# Patient Record
Sex: Female | Born: 1990 | Race: Black or African American | Hispanic: No | Marital: Single | Smoking: Current every day smoker
Health system: Southern US, Community
[De-identification: ages and names within clinical notes are randomized; demographics above are authoritative.]

---

## 2015-09-27 ENCOUNTER — Emergency Department (HOSPITAL_COMMUNITY): Payer: Self-pay

## 2015-09-27 ENCOUNTER — Encounter (HOSPITAL_COMMUNITY): Payer: Self-pay | Admitting: Emergency Medicine

## 2015-09-27 ENCOUNTER — Emergency Department (HOSPITAL_COMMUNITY)
Admission: EM | Admit: 2015-09-27 | Discharge: 2015-09-27 | Disposition: A | Discharge status: Home / Self Care | Payer: Self-pay | Attending: Emergency Medicine | Admitting: Emergency Medicine

## 2015-09-27 DIAGNOSIS — Z79899 Other long term (current) drug therapy: Secondary | ICD-10-CM | POA: Insufficient documentation

## 2015-09-27 DIAGNOSIS — T07XXXA Unspecified multiple injuries, initial encounter: Secondary | ICD-10-CM

## 2015-09-27 DIAGNOSIS — Y999 Unspecified external cause status: Secondary | ICD-10-CM | POA: Insufficient documentation

## 2015-09-27 DIAGNOSIS — Y939 Activity, unspecified: Secondary | ICD-10-CM | POA: Insufficient documentation

## 2015-09-27 DIAGNOSIS — R6884 Jaw pain: Secondary | ICD-10-CM | POA: Insufficient documentation

## 2015-09-27 DIAGNOSIS — F17200 Nicotine dependence, unspecified, uncomplicated: Secondary | ICD-10-CM | POA: Insufficient documentation

## 2015-09-27 DIAGNOSIS — Y929 Unspecified place or not applicable: Secondary | ICD-10-CM | POA: Insufficient documentation

## 2015-09-27 DIAGNOSIS — S61411A Laceration without foreign body of right hand, initial encounter: Secondary | ICD-10-CM | POA: Insufficient documentation

## 2015-09-27 DIAGNOSIS — M542 Cervicalgia: Secondary | ICD-10-CM | POA: Insufficient documentation

## 2015-09-27 MED ORDER — IBUPROFEN 800 MG PO TABS
800.0000 mg | ORAL_TABLET | Freq: Once | ORAL | Status: AC
  Administered 2015-09-27: 800 mg via ORAL
  Filled 2015-09-27: qty 1

## 2015-09-27 MED ORDER — IBUPROFEN 600 MG PO TABS: 600.0000 mg | ORAL_TABLET | Freq: Four times a day (QID) | ORAL | Status: DC | PRN

## 2015-09-27 MED ORDER — BACITRACIN ZINC 500 UNIT/GM EX OINT: 1.0000 "application " | TOPICAL_OINTMENT | Freq: Two times a day (BID) | CUTANEOUS | Status: DC
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
## 2015-09-27 NOTE — Discharge Instructions (Signed)
Contusion A contusion is a deep bruise. Contusions are the result of a blunt injury to tissues and muscle fibers under the skin. The injury causes bleeding under the skin. The skin overlying the contusion may turn blue, purple, or yellow. Minor injuries will give you a painless contusion, but more severe contusions may stay painful and swollen for a few weeks.  CAUSES  This condition is usually caused by a blow, trauma, or direct force to an area of the body. SYMPTOMS  Symptoms of this condition include:  Swelling of the injured area.  Pain and tenderness in the injured area.  Discoloration. The area may have redness and then turn blue, purple, or yellow. DIAGNOSIS  This condition is diagnosed based on a physical exam and medical history. An X-ray, CT scan, or MRI may be needed to determine if there are any associated injuries, such as broken bones (fractures). TREATMENT  Specific treatment for this condition depends on what area of the body was injured. In general, the best treatment for a contusion is resting, icing, applying pressure to (compression), and elevating the injured area. This is often called the RICE strategy. Over-the-counter anti-inflammatory medicines may also be recommended for pain control.  HOME CARE INSTRUCTIONS   Rest the injured area.  If directed, apply ice to the injured area:  Put ice in a plastic bag.  Place a towel between your skin and the bag.  Leave the ice on for 20 minutes, 2-3 times per day.  If directed, apply light compression to the injured area using an elastic bandage. Make sure the bandage is not wrapped too tightly. Remove and reapply the bandage as directed by your health care provider.  If possible, raise (elevate) the injured area above the level of your heart while you are sitting or lying down.  Take over-the-counter and prescription medicines only as told by your health care provider. SEEK MEDICAL CARE IF:  Your symptoms do not  improve after several days of treatment.  Your symptoms get worse.  You have difficulty moving the injured area. SEEK IMMEDIATE MEDICAL CARE IF:   You have severe pain.  You have numbness in a hand or foot.  Your hand or foot turns pale or cold.   This information is not intended to replace advice given to you by your health care provider. Make sure you discuss any questions you have with your health care provider.   Document Released: 01/02/2005 Document Revised: 12/14/2014 Document Reviewed: 08/10/2014 Elsevier Interactive Patient Education 2016 [employer]  Nonsutured Laceration Care A laceration is a cut that goes through all layers of the skin and extends into the tissue that is right under the skin. This type of cut is usually stitched up (sutured) or closed with tape (adhesive strips) or skin glue shortly after the injury happens. However, if the wound is dirty or if several hours pass before medical treatment is provided, it is likely that germs (bacteria) will enter the wound. Closing a laceration after bacteria have entered it increases the risk of infection. In these cases, your health care provider may leave the laceration open (nonsutured) and cover it with a bandage. This type of treatment helps prevent infection and allows the wound to heal from the deepest layer of tissue damage up to the surface. An open fracture is a type of injury that may involve nonsutured lacerations. An open fracture is a break in a bone that happens along with one or more lacerations through the skin that is  near the fracture site. HOW TO CARE FOR YOUR NONSUTURED LACERATION  Take or apply over-the-counter and prescription medicines only as told by your health care provider.  If you were prescribed an antibiotic medicine, take or apply it as told by your health care provider. Do not stop using the antibiotic even if your condition improves.  Clean the wound one time each day or as told by your  health care provider.  Wash the wound with mild soap and water.  Rinse the wound with water to remove all soap.  Pat your wound dry with a clean towel. Do not rub the wound.  Do not inject anything into the wound unless your health care provider told you to.  Change any bandages (dressings) as told by your health care provider. This includes changing the dressing if it gets wet, dirty, or starts to smell bad.  Keep the dressing dry until your health care provider says it can be removed. Do not take baths, swim, or do anything that puts your wound underwater until your health care provider approves.  Raise (elevate) the injured area above the level of your heart while you are sitting or lying down, if possible.  Do not scratch or pick at the wound.  Check your wound every day for signs of infection. Watch for:  Redness, swelling, or pain.  Fluid, blood, or pus.  Keep all follow-up visits as told by your health care provider. This is important. SEEK MEDICAL CARE IF:  You received a tetanus and shot and you have swelling, severe pain, redness, or bleeding at the injection site.   You have a fever.  Your pain is not controlled with medicine.  You have increased redness, swelling, or pain at the site of your wound.  You have fluid, blood, or pus coming from your wound.  You notice a bad smell coming from your wound or your dressing.  You notice something coming out of the wound, such as wood or glass.  You notice a change in the color of your skin near your wound.  You develop a new rash.  You need to change the dressing frequently due to fluid, blood, or pus draining from the wound.  You develop numbness around your wound. SEEK IMMEDIATE MEDICAL CARE IF:  Your pain suddenly increases and is severe.  You develop severe swelling around the wound.  The wound is on your hand or foot and you cannot properly move a finger or toe.  The wound is on your hand or foot and  you notice that your fingers or toes look pale or bluish.  You have a red streak going away from your wound.   This information is not intended to replace advice given to you by your health care provider. Make sure you discuss any questions you have with your health care provider.   Document Released: 02/20/2006 Document Revised: 08/09/2014 Document Reviewed: 03/21/2014 Elsevier Interactive Patient Education [employer]

## 2017-06-19 ENCOUNTER — Emergency Department (HOSPITAL_COMMUNITY): Payer: Medicaid Other

## 2017-06-19 ENCOUNTER — Emergency Department (HOSPITAL_COMMUNITY)
Admission: EM | Admit: 2017-06-19 | Discharge: 2017-06-19 | Disposition: A | Discharge status: Home / Self Care | Payer: Medicaid Other | Attending: Emergency Medicine | Admitting: Emergency Medicine

## 2017-06-19 ENCOUNTER — Encounter (HOSPITAL_COMMUNITY): Payer: Self-pay | Admitting: Emergency Medicine

## 2017-06-19 ENCOUNTER — Other Ambulatory Visit: Payer: Self-pay

## 2017-06-19 DIAGNOSIS — S161XXA Strain of muscle, fascia and tendon at neck level, initial encounter: Secondary | ICD-10-CM | POA: Diagnosis not present

## 2017-06-19 DIAGNOSIS — F17210 Nicotine dependence, cigarettes, uncomplicated: Secondary | ICD-10-CM | POA: Diagnosis not present

## 2017-06-19 DIAGNOSIS — Y999 Unspecified external cause status: Secondary | ICD-10-CM | POA: Insufficient documentation

## 2017-06-19 DIAGNOSIS — M791 Myalgia, unspecified site: Secondary | ICD-10-CM | POA: Diagnosis not present

## 2017-06-19 DIAGNOSIS — Y929 Unspecified place or not applicable: Secondary | ICD-10-CM | POA: Diagnosis not present

## 2017-06-19 DIAGNOSIS — R51 Headache: Secondary | ICD-10-CM | POA: Diagnosis present

## 2017-06-19 DIAGNOSIS — Y9389 Activity, other specified: Secondary | ICD-10-CM | POA: Insufficient documentation

## 2017-06-19 MED ORDER — KETOROLAC TROMETHAMINE 15 MG/ML IJ SOLN
15.0000 mg | Freq: Once | INTRAMUSCULAR | Status: AC
  Administered 2017-06-19: 15 mg via INTRAMUSCULAR
  Filled 2017-06-19: qty 1

## 2017-06-19 MED ORDER — IBUPROFEN 800 MG PO TABS
800.0000 mg | ORAL_TABLET | Freq: Three times a day (TID) | ORAL | 0 refills | Status: DC
Start: 2017-06-19 — End: 2022-06-07

## 2017-06-19 MED ORDER — METHOCARBAMOL 500 MG PO TABS: 500.0000 mg | ORAL_TABLET | Freq: Two times a day (BID) | ORAL | 0 refills | Status: DC | PRN
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
## 2021-01-10 DIAGNOSIS — Z1388 Encounter for screening for disorder due to exposure to contaminants: Secondary | ICD-10-CM | POA: Diagnosis not present

## 2021-01-10 DIAGNOSIS — Z0389 Encounter for observation for other suspected diseases and conditions ruled out: Secondary | ICD-10-CM | POA: Diagnosis not present

## 2021-01-10 DIAGNOSIS — Z3009 Encounter for other general counseling and advice on contraception: Secondary | ICD-10-CM | POA: Diagnosis not present

## 2021-07-17 DIAGNOSIS — Z114 Encounter for screening for human immunodeficiency virus [HIV]: Secondary | ICD-10-CM | POA: Diagnosis not present

## 2021-07-17 DIAGNOSIS — Z113 Encounter for screening for infections with a predominantly sexual mode of transmission: Secondary | ICD-10-CM | POA: Diagnosis not present

## 2021-07-17 DIAGNOSIS — N390 Urinary tract infection, site not specified: Secondary | ICD-10-CM | POA: Diagnosis not present

## 2021-07-17 DIAGNOSIS — Z30015 Encounter for initial prescription of vaginal ring hormonal contraceptive: Secondary | ICD-10-CM | POA: Diagnosis not present

## 2021-07-17 DIAGNOSIS — Z30012 Encounter for prescription of emergency contraception: Secondary | ICD-10-CM | POA: Diagnosis not present

## 2022-06-07 ENCOUNTER — Encounter: Payer: Self-pay | Admitting: Emergency Medicine

## 2022-06-07 ENCOUNTER — Emergency Department
Admission: EM | Admit: 2022-06-07 | Discharge: 2022-06-07 | Disposition: A | Discharge status: Home / Self Care | Payer: Medicaid Other | Attending: Emergency Medicine | Admitting: Emergency Medicine

## 2022-06-07 ENCOUNTER — Other Ambulatory Visit: Payer: Self-pay

## 2022-06-07 DIAGNOSIS — J020 Streptococcal pharyngitis: Secondary | ICD-10-CM | POA: Insufficient documentation

## 2022-06-07 DIAGNOSIS — Z20822 Contact with and (suspected) exposure to covid-19: Secondary | ICD-10-CM | POA: Diagnosis not present

## 2022-06-07 DIAGNOSIS — J029 Acute pharyngitis, unspecified: Secondary | ICD-10-CM | POA: Diagnosis present

## 2022-06-07 LAB — RESP PANEL BY RT-PCR (RSV, FLU A&B, COVID)  RVPGX2
Influenza A by PCR: NEGATIVE
Influenza B by PCR: NEGATIVE
Resp Syncytial Virus by PCR: NEGATIVE
SARS Coronavirus 2 by RT PCR: NEGATIVE

## 2022-06-07 LAB — GROUP A STREP BY PCR: Group A Strep by PCR: DETECTED — AB

## 2022-06-07 MED ORDER — AMOXICILLIN 875 MG PO TABS: 875.0000 mg | ORAL_TABLET | Freq: Two times a day (BID) | ORAL | 0 refills | Status: DC

## 2022-06-07 NOTE — ED Provider Notes (Signed)
[facility] Provider Note    Event Date/Time   First MD Initiated Contact with Patient 06/07/22 [phone]     (approximate)   History   Headache and Sore Throat   HPI  Alice Ford is a 32 y.o. female   presents to the ED with complaint of headache and sore throat for the last 5 days.  Patient is unaware of any known fever but states that she has been seen at an urgent care in [place] where she was told that her swabs were negative.  She was given Toradol injection for her headache which has not helped.      Physical Exam   Triage Vital Signs: ED Triage Vitals  Enc Vitals Group     BP 06/07/22 0808 129/68     Pulse Rate 06/07/22 0808 93     Resp 06/07/22 0808 18     Temp 06/07/22 0808 98.9 F (37.2 C)     Temp Source 06/07/22 0808 Oral     SpO2 06/07/22 0808 98 %     Weight 06/07/22 0809 119 lb 14.9 oz (54.4 kg)     Height 06/07/22 0809 '5\' 3"'$  (1.6 m)     Head Circumference --      Peak Flow --      Pain Score 06/07/22 0809 9     Pain Loc --      Pain Edu? --      Excl. in [place]? --     Most recent vital signs: Vitals:   06/07/22 0808  BP: 129/68  Pulse: 93  Resp: 18  Temp: 98.9 F (37.2 C)  SpO2: 98%     General: Awake, no distress.  CV:  Good peripheral perfusion.  Heart regular rate and rhythm. Resp:  Normal effort.  Lungs are clear bilaterally. Abd:  No distention.  Other:  Posterior pharynx with enlarged tonsils and mild erythema.  No exudate is noted.  Uvula is midline.  Neck is supple with minimal cervical lymphadenopathy.   ED Results / Procedures / Treatments   Labs (all labs ordered are listed, but only abnormal results are displayed) Labs Reviewed  GROUP A STREP BY PCR - Abnormal; Notable for the following components:      Result Value   Group A Strep by PCR DETECTED (*)    All other components within normal limits  RESP PANEL BY RT-PCR (RSV, FLU A&B, COVID)  [id number]      PROCEDURES:  Critical Care performed:    Procedures   MEDICATIONS ORDERED IN ED: Medications - No data to display   IMPRESSION / MDM / [place] / ED COURSE  I reviewed the triage vital signs and the nursing notes.   Differential diagnosis includes, but is not limited to, COVID, influenza, RSV, strep pharyngitis, viral illness.  32 year old female presents to the ED with complaint of sore throat and headache for the last 5 days.  Tonsils are enlarged but no exudate was noted on exam.  Positive cervical lymphadenopathy.  Strep test is positive.  Patient was made aware and a prescription for amoxicillin 875 twice daily for 10 days was sent to the pharmacy.  She is aware that she can review the results of her respiratory swab online but would not change the medication that she is getting today.  She is aware that she needs to complete this medication.  She may take Tylenol or ibuprofen as needed for throat pain, headache or bodyaches.  A note  was written for her to remain out of work over the weekend.      Patient's presentation is most consistent with acute complicated illness / injury requiring diagnostic workup.  FINAL CLINICAL IMPRESSION(S) / ED DIAGNOSES   Final diagnoses:  Strep pharyngitis     Rx / DC Orders   ED Discharge Orders          Ordered    amoxicillin (AMOXIL) 875 MG tablet  2 times daily        06/07/22 0856             Note:  This document was prepared using Dragon voice recognition software and may include unintentional dictation errors.   Johnn Hai, PA-C 06/07/22 [id number]    Carrie Mew, MD 06/08/22 [phone]

## 2022-06-07 NOTE — Discharge Instructions (Addendum)
Follow-up with your primary care provider if any continued problems or concerns.  A prescription for amoxicillin 875 twice daily was sent to the pharmacy for you to begin taking for the next 10 days.  You may take Tylenol or ibuprofen as needed for throat pain, fever or bodyaches.  This will also help with your headache that is coming from the infection in your throat.  Increase fluids to stay hydrated.  You are contagious for 24 hours after starting the antibiotic.  After 2 to 3 days throw the toothbrush that you are currently using away and obtain a new toothbrush as your toothbrush now has the strep germ on it.  The test results for your COVID and influenza test have not resulted but you can see the results of this on MyChart.  Your medications would not change even if these are positive.

## 2022-06-07 NOTE — ED Notes (Signed)
See triage note  Presents with a sore throat since Monday   Then developed h/a  Was seen at [facility] and given a Toradol shot    States she still having same sx's  Increased pain with swallowing  And states pain is too back of her head  describes as pressure
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
## 2022-09-12 ENCOUNTER — Other Ambulatory Visit: Payer: Self-pay

## 2022-09-12 ENCOUNTER — Emergency Department: Payer: Medicaid Other

## 2022-09-12 ENCOUNTER — Emergency Department
Admission: EM | Admit: 2022-09-12 | Discharge: 2022-09-12 | Disposition: A | Discharge status: Home / Self Care | Payer: Medicaid Other | Attending: Emergency Medicine | Admitting: Emergency Medicine

## 2022-09-12 ENCOUNTER — Encounter: Payer: Self-pay | Admitting: Emergency Medicine

## 2022-09-12 DIAGNOSIS — R059 Cough, unspecified: Secondary | ICD-10-CM | POA: Diagnosis not present

## 2022-09-12 DIAGNOSIS — J0110 Acute frontal sinusitis, unspecified: Secondary | ICD-10-CM | POA: Diagnosis not present

## 2022-09-12 DIAGNOSIS — R0981 Nasal congestion: Secondary | ICD-10-CM | POA: Diagnosis present

## 2022-09-12 LAB — POC URINE PREG, ED: Preg Test, Ur: NEGATIVE

## 2022-09-12 MED ORDER — PREDNISONE 10 MG PO TABS: ORAL_TABLET | ORAL | 0 refills | Status: DC

## 2022-09-12 MED ORDER — PSEUDOEPH-BROMPHEN-DM 30-2-10 MG/5ML PO SYRP: 5.0000 mL | ORAL_SOLUTION | Freq: Four times a day (QID) | ORAL | 0 refills | Status: DC | PRN

## 2022-09-12 MED ORDER — AMOXICILLIN-POT CLAVULANATE 875-125 MG PO TABS: 1.0000 | ORAL_TABLET | Freq: Two times a day (BID) | ORAL | 0 refills | Status: DC

## 2022-09-12 NOTE — ED Provider Notes (Signed)
[facility] Provider Note    Event Date/Time   First MD Initiated Contact with Patient 09/12/22 [phone]     (approximate)   History   Cough   HPI  Alice Ford is a 32 y.o. female   presents to the ED with complaint of sinus pain and congestion for greater than 2 weeks with worsening over the past 2 days.  Patient denies any fever or chills.  She has been taking over-the-counter medication without any relief.      Physical Exam   Triage Vital Signs: ED Triage Vitals [09/12/22 0816]  Enc Vitals Group     BP 137/74     Pulse Rate 77     Resp 16     Temp 98.6 F (37 C)     Temp Source Oral     SpO2 97 %     Weight 135 lb (61.2 kg)     Height 5\' 3"  (1.6 m)     Head Circumference      Peak Flow      Pain Score      Pain Loc      Pain Edu?      Excl. in [place]?     Most recent vital signs: Vitals:   09/12/22 0816  BP: 137/74  Pulse: 77  Resp: 16  Temp: 98.6 F (37 C)  SpO2: 97%     General: Awake, no distress.  CV:  Good peripheral perfusion.  Resp:  Normal effort.  There are bilaterally. Abd:  No distention.  Other:  Moderate tenderness on percussion of the left frontal sinus.  Nasal mucosa boggy.  Posterior pharynx with minimal posterior drainage.  No erythema, uvula is midline.  Neck is supple without cervical lymphadenopathy.   ED Results / Procedures / Treatments   Labs (all labs ordered are listed, but only abnormal results are displayed) Labs Reviewed  POC URINE PREG, ED     RADIOLOGY Chest x-ray images were reviewed by myself independent of the radiologist was negative for acute cardiopulmonary abnormalities.    PROCEDURES:  Critical Care performed:   Procedures   MEDICATIONS ORDERED IN ED: Medications - No data to display   IMPRESSION / MDM / ASSESSMENT AND PLAN / ED COURSE  I reviewed the triage vital signs and the nursing notes.   Differential diagnosis includes, but is not limited to, upper respiratory  infection, pneumonia, seasonal allergies, sinusitis.  32 year old female presents to the ED with complaint of URI symptoms for approximately 2 weeks with worsening of her symptoms the last 2 days.  Chest x-ray was reassuring and patient was made aware that most likely she now has developed a sinusitis.  A prescription for Bromfed-DM, Augmentin 875 and prednisone was sent to the pharmacy for her to begin taking.  She has to follow-up with [facility] if any continued problems or no improvement.      Patient's presentation is most consistent with acute complicated illness / injury requiring diagnostic workup.  FINAL CLINICAL IMPRESSION(S) / ED DIAGNOSES   Final diagnoses:  Acute non-recurrent frontal sinusitis     Rx / DC Orders   ED Discharge Orders          Ordered    predniSONE (DELTASONE) 10 MG tablet        09/12/22 0952    amoxicillin-clavulanate (AUGMENTIN) 875-125 MG tablet  2 times daily        09/12/22 0952    brompheniramine-pseudoephedrine-DM 30-2-10 MG/5ML syrup  4 times daily PRN        09/12/22 1610             Note:  This document was prepared using Dragon voice recognition software and may include unintentional dictation errors.   Tommi Rumps, PA-C 09/12/22 1639    Sharman Cheek, MD 09/24/22 [phone]

## 2022-09-12 NOTE — ED Notes (Signed)
See triage note  Presents with a hx of some sinus pressure/drainage for about 2 weeks  Then developed cough  States she noticed some chest discomfort with cough and inspiration  Afebrile on arrival

## 2022-09-12 NOTE — Discharge Instructions (Signed)
Follow-up with your primary care provider or make an appointment with Dr. Sheran Spine who is on-call for [place] [facility] if you continue having problems with your sinuses, allergies or throat with coughing. Take all the medicine until completely finished.  Increase fluids.  You may also take Tylenol or ibuprofen if needed for sinus headache.

## 2022-09-12 NOTE — ED Triage Notes (Signed)
Patient arrives ambulatory by POV c/o sinus pain and over past two days having a cough.

## 2023-01-14 ENCOUNTER — Ambulatory Visit (HOSPITAL_COMMUNITY)
Admission: EM | Admit: 2023-01-14 | Discharge: 2023-01-14 | Disposition: A | Discharge status: Home / Self Care | Payer: Medicaid Other | Attending: Internal Medicine | Admitting: Internal Medicine

## 2023-01-14 ENCOUNTER — Encounter (HOSPITAL_COMMUNITY): Payer: Self-pay | Admitting: Emergency Medicine

## 2023-01-14 DIAGNOSIS — Z1152 Encounter for screening for COVID-19: Secondary | ICD-10-CM | POA: Diagnosis not present

## 2023-01-14 DIAGNOSIS — B349 Viral infection, unspecified: Secondary | ICD-10-CM | POA: Diagnosis not present

## 2023-01-14 NOTE — Discharge Instructions (Addendum)
COVID test results will be released to your MyChart account.  If positive follow CDC guidelines.  At home if you develop fever.  Wear mask while around others while you are sick

## 2023-01-14 NOTE — ED Triage Notes (Signed)
Pt had Migraine, congestion, cough up phlegm-green, hot sweats, since Sunday. Taking Motrin and home remedies-tea & honey.

## 2023-01-14 NOTE — ED Provider Notes (Signed)
[facility]    CSN: [id number] Arrival date & time: 01/14/23  3086      History   Chief Complaint Chief Complaint  Patient presents with   Cough   Nasal Congestion    HPI Alice Ford is a 32 y.o. female.    Cough Associated symptoms: diaphoresis, headaches, rhinorrhea, sore throat and wheezing (Had some wheezing this morning resolved denies history of asthma)   Associated symptoms: no chills and no fever   Sick for 3 days started with a sore throat then developed nasal congestion, rhinorrhea, cough and headache.  Had sweats at night but no documented fever.  Admits work exposure to COVID upper respiratory infections and pneumonia.  Her daughter was sick for 1 day.  Aching over-the-counter medicines with some relief.  No daily medications, no significant past medical history, no known drug allergies  History reviewed. No pertinent past medical history.  There are no problems to display for this patient.   History reviewed. No pertinent surgical history.  OB History   No obstetric history on file.      Home Medications    Prior to Admission medications   Medication Sig Start Date End Date Taking? Authorizing Provider  amoxicillin-clavulanate (AUGMENTIN) 875-125 MG tablet Take 1 tablet by mouth 2 (two) times daily. 09/12/22   Tommi Rumps, PA-C  brompheniramine-pseudoephedrine-DM 30-2-10 MG/5ML syrup Take 5 mLs by mouth 4 (four) times daily as needed. 09/12/22   Tommi Rumps, PA-C  predniSONE (DELTASONE) 10 MG tablet Take 6 tablets  today, on day 2 take 5 tablets, day 3 take 4 tablets, day 4 take 3 tablets, day 5 take  2 tablets and 1 tablet the last day 09/12/22   Tommi Rumps, PA-C    Family History No family history on file.  Social History Social History   Tobacco Use   Smoking status: Every Day  Substance Use Topics   Alcohol use: Yes   Drug use: No     Allergies   Patient has no known allergies.   Review of Systems Review of  Systems  Constitutional:  Positive for diaphoresis. Negative for appetite change, chills, fatigue and fever.  HENT:  Positive for rhinorrhea and sore throat. Negative for trouble swallowing and voice change.   Respiratory:  Positive for cough and wheezing (Had some wheezing this morning resolved denies history of asthma).   Gastrointestinal:  Positive for vomiting (1 episode of posttussive emesis this morning). Negative for abdominal pain, diarrhea and nausea.  Neurological:  Positive for headaches.     Physical Exam Triage Vital Signs ED Triage Vitals  Encounter Vitals Group     BP 01/14/23 0851 110/71     Systolic BP Percentile --      Diastolic BP Percentile --      Pulse Rate 01/14/23 0851 85     Resp 01/14/23 0851 17     Temp 01/14/23 0851 98.7 F (37.1 C)     Temp Source 01/14/23 0851 Oral     SpO2 01/14/23 0851 96 %     Weight --      Height --      Head Circumference --      Peak Flow --      Pain Score 01/14/23 0850 8     Pain Loc --      Pain Education --      Exclude from Growth Chart --    No data found.  Updated Vital Signs BP 110/71 (  BP Location: Right Arm)   Pulse 85   Temp 98.7 F (37.1 C) (Oral)   Resp 17   SpO2 96%   Visual Acuity Right Eye Distance:   Left Eye Distance:   Bilateral Distance:    Right Eye Near:   Left Eye Near:    Bilateral Near:     Physical Exam Vitals and nursing note reviewed.  Constitutional:      Appearance: She is not ill-appearing.  HENT:     Head: Normocephalic and atraumatic.     Right Ear: Tympanic membrane and ear canal normal.     Left Ear: Tympanic membrane and ear canal normal.     Nose: Congestion and rhinorrhea present.     Mouth/Throat:     Mouth: Mucous membranes are moist.     Pharynx: Oropharynx is clear. No oropharyngeal exudate or posterior oropharyngeal erythema.  Eyes:     Conjunctiva/sclera: Conjunctivae normal.  Cardiovascular:     Rate and Rhythm: Normal rate and regular rhythm.     Heart  sounds: Normal heart sounds.  Pulmonary:     Effort: Pulmonary effort is normal.     Breath sounds: Normal breath sounds. No wheezing.  Musculoskeletal:     Cervical back: Neck supple.  Lymphadenopathy:     Cervical: No cervical adenopathy.  Skin:    General: Skin is warm and dry.  Neurological:     Mental Status: She is alert and oriented to person, place, and time.      UC Treatments / Results  Labs (all labs ordered are listed, but only abnormal results are displayed) Labs Reviewed - No data to display  EKG   Radiology No results found.  Procedures Procedures (including critical care time)  Medications Ordered in UC Medications - No data to display  Initial Impression / Assessment and Plan / UC Course  I have reviewed the triage vital signs and the nursing notes.  Pertinent labs & imaging results that were available during my care of the patient were reviewed by me and considered in my medical decision making (see chart for details).     32 year old female with nasal congestion, rhinorrhea, cough, sweats and wheezing x 3 days, recent exposure to COVID.  Well-appearing, vital signs are stable, afebrile, not hypoxic or tachypneic.  Will do COVID PCR, recommend over-the-counter medicines for symptoms, warning signs and follow-up reviewed, recommend masking while symptomatic.  If COVID-positive follow CDC guidelines. Final Clinical Impressions(s) / UC Diagnoses   Final diagnoses:  None   Discharge Instructions   None    ED Prescriptions   None    PDMP not reviewed this encounter.   Meliton Rattan, [place] 01/14/23 [phone]

## 2023-01-15 LAB — SARS CORONAVIRUS 2 (TAT 6-24 HRS): SARS Coronavirus 2: NEGATIVE

## 2023-01-22 ENCOUNTER — Emergency Department
Admission: EM | Admit: 2023-01-22 | Discharge: 2023-01-22 | Disposition: A | Discharge status: Home / Self Care | Payer: Medicaid Other | Attending: Student | Admitting: Student

## 2023-01-22 ENCOUNTER — Other Ambulatory Visit: Payer: Self-pay

## 2023-01-22 DIAGNOSIS — B9789 Other viral agents as the cause of diseases classified elsewhere: Secondary | ICD-10-CM | POA: Diagnosis not present

## 2023-01-22 DIAGNOSIS — J069 Acute upper respiratory infection, unspecified: Secondary | ICD-10-CM | POA: Insufficient documentation

## 2023-01-22 DIAGNOSIS — Z1152 Encounter for screening for COVID-19: Secondary | ICD-10-CM | POA: Insufficient documentation

## 2023-01-22 DIAGNOSIS — R059 Cough, unspecified: Secondary | ICD-10-CM | POA: Diagnosis present

## 2023-01-22 LAB — SARS CORONAVIRUS 2 BY RT PCR: SARS Coronavirus 2 by RT PCR: NEGATIVE

## 2023-01-22 LAB — GROUP A STREP BY PCR: Group A Strep by PCR: NOT DETECTED

## 2023-01-22 NOTE — ED Notes (Signed)
See triage notes. Patient c/o productive cough, congestion and sore throat for one and a half weeks. Patient wanting a work note.

## 2023-01-22 NOTE — ED Provider Notes (Signed)
[facility] Provider Note    None    (approximate)   History   Cough   HPI  Alice Ford is a 32 y.o. female who presents today with request for work note.  Patient reports that she had cough and nasal congestion since Monday of last week and she went to urgent care on Tuesday and had a negative COVID test.  She reports that her work requires that she wear a mask for 5 days, however she feels that her mask is making her symptoms worse.  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  She feels significantly improved with her mask off.  She denies any chest pain or shortness of breath.  She has not had any abdominal pain, nausea, vomiting, diarrhea, fevers, or chills.  She is requesting a work note saying that she does not need to wear her mask.  She reports that her symptoms for like her seasonal allergies which she reports always happen this time a year.  There are no problems to display for this patient.         Physical Exam   Triage Vital Signs: ED Triage Vitals  Encounter Vitals Group     BP 01/22/23 0715 (!) 127/97     Systolic BP Percentile --      Diastolic BP Percentile --      Pulse Rate 01/22/23 0715 75     Resp 01/22/23 0715 18     Temp 01/22/23 0715 98.2 F (36.8 C)     Temp Source 01/22/23 0715 Oral     SpO2 01/22/23 0715 100 %     Weight 01/22/23 0713 140 lb (63.5 kg)     Height 01/22/23 0713 5\' 3"  (1.6 m)     Head Circumference --      Peak Flow --      Pain Score 01/22/23 0736 0     Pain Loc --      Pain Education --      Exclude from Growth Chart --     Most recent vital signs: Vitals:   01/22/23 0715  BP: (!) 127/97  Pulse: 75  Resp: 18  Temp: 98.2 F (36.8 C)  SpO2: 100%    Physical Exam Vitals and nursing note reviewed.  Constitutional:      General: Awake and alert. No acute distress.    Appearance: Normal  appearance. The patient is normal weight.  HENT:     Head: Normocephalic and atraumatic.     Mouth: Mucous membranes are moist.  Eyes:     General: PERRL. Normal EOMs        Right eye: No discharge.        Left eye: No discharge.     Conjunctiva/sclera: Conjunctivae normal.  Cardiovascular:     Rate and Rhythm: Normal rate and regular rhythm.     Pulses: Normal pulses.  Pulmonary:     Effort: Pulmonary effort is normal. No respiratory distress.     Breath sounds: Normal breath sounds.  Abdominal:     Abdomen is soft. There is no abdominal tenderness. No rebound or guarding. No distention. Musculoskeletal:        General: No swelling. Normal range of motion.     Cervical back: Normal range of motion and neck supple.  Skin:    General: Skin is warm and dry.  Capillary Refill: Capillary refill takes less than 2 seconds.     Findings: No rash.  Neurological:     Mental Status: The patient is awake and alert.      ED Results / Procedures / Treatments   Labs (all labs ordered are listed, but only abnormal results are displayed) Labs Reviewed  SARS CORONAVIRUS 2 BY RT PCR  GROUP A STREP BY PCR     EKG     RADIOLOGY     PROCEDURES:  Critical Care performed:   Procedures   MEDICATIONS ORDERED IN ED: Medications - No data to display   IMPRESSION / MDM / ASSESSMENT AND PLAN / ED COURSE  I reviewed the triage vital signs and the nursing notes.   Differential diagnosis includes, but is not limited to, URI, chemical irritant, COVID, seasonal allergies.  Patient is awake and alert, hemodynamically stable and afebrile.  I was able to review her chart and I was able to see her negative COVID test from before.  Patient agrees to retesting today.  However she did not wish to wait for her results.  She feels quite certain that her symptoms are due to her seasonal allergies.  She does not have any chest pain, shortness of breath, pleurisy, dyspnea on exertion, calf  pain or leg swelling to suggest ACS or PE.  Given her negative COVID test, she does not need to wear her mask per CDC guidelines.  Patient was given a work note allowing her to remove her mask.  Her swabs today were again negative.  Patient is reassured by these findings and to receive her work note.  We discussed return precautions and outpatient follow-up.  Patient understands and agrees with plan.  She was discharged in stable condition.   Patient's presentation is most consistent with acute complicated illness / injury requiring diagnostic workup.     FINAL CLINICAL IMPRESSION(S) / ED DIAGNOSES   Final diagnoses:  Viral URI with cough     Rx / DC Orders   ED Discharge Orders     None        Note:  This document was prepared using Dragon voice recognition software and may include unintentional dictation errors.   Jackelyn Hoehn, PA-C 01/22/23 0845    Minna Antis, MD 01/22/23 1439

## 2023-01-22 NOTE — Discharge Instructions (Addendum)
We will call you if your results are positive.  Please return for any new, worsening, or change in symptoms or other concerns.  It was a pleasure caring for you today.

## 2023-01-22 NOTE — ED Triage Notes (Signed)
Patient states productive cough, congestion and sore throat for 1.5 weeks; was seen at [facility] on 01/14/2023 and tested negative for Covid. States she needs a work note to be able to work without a mask because she said she feels like her symptoms aren't improving due to wearing a mask.

## 2023-02-05 ENCOUNTER — Telehealth (HOSPITAL_COMMUNITY): Payer: Self-pay | Admitting: Emergency Medicine

## 2023-02-12 ENCOUNTER — Other Ambulatory Visit: Payer: Self-pay | Admitting: Family

## 2023-02-12 DIAGNOSIS — Z1329 Encounter for screening for other suspected endocrine disorder: Secondary | ICD-10-CM | POA: Diagnosis not present

## 2023-02-12 DIAGNOSIS — Z139 Encounter for screening, unspecified: Secondary | ICD-10-CM | POA: Diagnosis not present

## 2023-02-12 DIAGNOSIS — N926 Irregular menstruation, unspecified: Secondary | ICD-10-CM | POA: Diagnosis not present

## 2023-02-12 DIAGNOSIS — Z1389 Encounter for screening for other disorder: Secondary | ICD-10-CM | POA: Diagnosis not present

## 2023-02-12 DIAGNOSIS — Z1159 Encounter for screening for other viral diseases: Secondary | ICD-10-CM | POA: Diagnosis not present

## 2023-02-12 DIAGNOSIS — Z136 Encounter for screening for cardiovascular disorders: Secondary | ICD-10-CM | POA: Diagnosis not present

## 2023-02-12 DIAGNOSIS — Z1331 Encounter for screening for depression: Secondary | ICD-10-CM | POA: Diagnosis not present

## 2023-02-12 DIAGNOSIS — Z8041 Family history of malignant neoplasm of ovary: Secondary | ICD-10-CM | POA: Diagnosis not present

## 2023-02-12 DIAGNOSIS — Z113 Encounter for screening for infections with a predominantly sexual mode of transmission: Secondary | ICD-10-CM | POA: Diagnosis not present

## 2023-02-12 DIAGNOSIS — Z13220 Encounter for screening for lipoid disorders: Secondary | ICD-10-CM | POA: Diagnosis not present

## 2023-02-12 DIAGNOSIS — Z1321 Encounter for screening for nutritional disorder: Secondary | ICD-10-CM | POA: Diagnosis not present

## 2023-02-12 DIAGNOSIS — Z131 Encounter for screening for diabetes mellitus: Secondary | ICD-10-CM | POA: Diagnosis not present

## 2023-02-12 DIAGNOSIS — Z114 Encounter for screening for human immunodeficiency virus [HIV]: Secondary | ICD-10-CM | POA: Diagnosis not present

## 2023-02-12 DIAGNOSIS — Z23 Encounter for immunization: Secondary | ICD-10-CM | POA: Diagnosis not present

## 2023-02-12 DIAGNOSIS — F419 Anxiety disorder, unspecified: Secondary | ICD-10-CM | POA: Diagnosis not present

## 2023-02-12 DIAGNOSIS — Z130 Encounter for screening for diseases of the blood and blood-forming organs and certain disorders involving the immune mechanism: Secondary | ICD-10-CM | POA: Diagnosis not present

## 2023-02-13 ENCOUNTER — Ambulatory Visit
Admission: RE | Admit: 2023-02-13 | Discharge: 2023-02-13 | Disposition: A | Discharge status: Home / Self Care | Payer: Medicaid Other | Source: Ambulatory Visit | Attending: Family | Admitting: Family

## 2023-02-13 DIAGNOSIS — N926 Irregular menstruation, unspecified: Secondary | ICD-10-CM

## 2023-04-16 DIAGNOSIS — N898 Other specified noninflammatory disorders of vagina: Secondary | ICD-10-CM | POA: Diagnosis not present

## 2023-04-16 DIAGNOSIS — Z1331 Encounter for screening for depression: Secondary | ICD-10-CM | POA: Diagnosis not present

## 2023-04-16 DIAGNOSIS — F419 Anxiety disorder, unspecified: Secondary | ICD-10-CM | POA: Diagnosis not present

## 2023-04-16 DIAGNOSIS — Z716 Tobacco abuse counseling: Secondary | ICD-10-CM | POA: Diagnosis not present

## 2023-04-16 DIAGNOSIS — Z139 Encounter for screening, unspecified: Secondary | ICD-10-CM | POA: Diagnosis not present

## 2023-04-16 DIAGNOSIS — Z01419 Encounter for gynecological examination (general) (routine) without abnormal findings: Secondary | ICD-10-CM | POA: Diagnosis not present

## 2023-04-16 DIAGNOSIS — Z720 Tobacco use: Secondary | ICD-10-CM | POA: Diagnosis not present

## 2023-04-16 DIAGNOSIS — Z124 Encounter for screening for malignant neoplasm of cervix: Secondary | ICD-10-CM | POA: Diagnosis not present

## 2023-04-23 DIAGNOSIS — F411 Generalized anxiety disorder: Secondary | ICD-10-CM | POA: Diagnosis not present

## 2023-04-30 DIAGNOSIS — F411 Generalized anxiety disorder: Secondary | ICD-10-CM | POA: Diagnosis not present

## 2023-05-07 DIAGNOSIS — F411 Generalized anxiety disorder: Secondary | ICD-10-CM | POA: Diagnosis not present

## 2023-06-02 ENCOUNTER — Encounter: Payer: Self-pay | Admitting: Family Medicine

## 2023-06-02 ENCOUNTER — Ambulatory Visit: Payer: Medicaid Other | Admitting: Family Medicine

## 2023-06-02 VITALS — BP 117/76 | HR 88 | Ht 63.0 in | Wt 152.2 lb

## 2023-06-02 DIAGNOSIS — N760 Acute vaginitis: Secondary | ICD-10-CM

## 2023-06-02 DIAGNOSIS — B9689 Other specified bacterial agents as the cause of diseases classified elsewhere: Secondary | ICD-10-CM

## 2023-06-02 DIAGNOSIS — R87810 Cervical high risk human papillomavirus (HPV) DNA test positive: Secondary | ICD-10-CM | POA: Diagnosis not present

## 2023-06-02 MED ORDER — METRONIDAZOLE 500 MG PO TABS
500.0000 mg | ORAL_TABLET | Freq: Two times a day (BID) | ORAL | 0 refills | Status: AC
Start: 2023-06-02 — End: 2023-06-09

## 2023-06-02 NOTE — Patient Instructions (Signed)
 You can look for ovulation prediction kits to help better time conception attempts Please start on prenatal vitamins

## 2023-06-02 NOTE — Progress Notes (Unsigned)
 Pt. Presents for irregular periods. Last period was on 2/8. Pt. Has a lot of questions about her HPV diagnosis. Last pap was done on 01/18

## 2023-06-03 ENCOUNTER — Encounter: Payer: Self-pay | Admitting: Family Medicine

## 2023-06-03 NOTE — Progress Notes (Signed)
 GYN Problem Visit  SUBJECTIVE HPI:  Ms. Alice Ford is a 33 y.o. who presents to clinic today for evaluation of irregular menses.  Irregular menses: States periods have been irregular since menarche at age 40. She can frequently miss a period and have a much longer period the next month. This is not uncommon for her. She has never gone more than 2 months without a period. She reports this has been normal for her her whole life but is wondering how this may affect her fertility.  HPV: HPV seen on most recent pap. She is worried about her risk of developing genital warts due to HPV positive status -- has never seen any warts.  Vaginal dryness: She states she was told she had BV, but pt never experienced a change in discharge or malodorous discharge which made her concerned about BV. She has noticed more vaginal dryness when she is engaging in intercourse with her partner. This doesn't seem to be the case when she is masturbating. She wonders if this may be due to her BV.  History reviewed. No pertinent past medical history. History reviewed. No pertinent surgical history. Social History   Socioeconomic History   Marital status: Single    Spouse name: Not on file   Number of children: Not on file   Years of education: Not on file   Highest education level: Not on file  Occupational History   Not on file  Tobacco Use   Smoking status: Every Day   Smokeless tobacco: Not on file  Substance and Sexual Activity   Alcohol use: Yes   Drug use: No   Sexual activity: Yes    Birth control/protection: None  Other Topics Concern   Not on file  Social History Narrative   Not on file   Social Drivers of Health   Financial Resource Strain: Not at Risk (04/16/2023)   Received from [employer]    Financial Resource Strain: 1  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, medical Strain - At Risk (02/12/2023)   Received from [employer]    Financial Resource Strain: 2   Food Insecurity: Not at Risk (04/16/2023)   Received from [facility] Insecurity    Food: 1  Transportation Needs: Not at Risk (04/16/2023)   Received from [facility] Needs    Transportation: 1  Physical Activity: Not on File (01/17/2023)   Received from [facility]   Physical Activity    Physical Activity: 0  Stress: Not on File (01/17/2023)   Received from [facility]   Stress    Stress: 0  Social Connections: Not on File (01/17/2023)   Received from [facility]   Social Connections    Connectedness: 0  Intimate Partner Violence: Not on file   No current outpatient medications on file prior to visit.   No current facility-administered medications on file prior to visit.   No Known Allergies  I have reviewed patient's Past Medical Hx, Surgical Hx, Family Hx, Social Hx, medications and allergies.    Physical Exam  BP 117/76   Pulse 88   Ht 5\' 3"  (1.6 m)   Wt 152 lb 3.2 oz (69 kg)   LMP 05/17/2023 (Exact Date)   BMI 26.96 kg/m   GENERAL: Well-developed, well-nourished female in no acute distress.  HEENT: Normocephalic, atraumatic.   LUNGS: Effort normal ABDOMEN: soft, non-tender HEART: Regular rate  SKIN: Warm, dry and without erythema PSYCH: Normal mood and affect NEURO: Alert  and oriented x 4 GU: Deferred  ASSESSMENT 1. Papanicolaou smear of cervix with positive high risk human papilloma virus (HPV) test   2. BV (bacterial vaginosis)     PLAN #Irregular menses: Pt has never missed greater than two periods and has regular periods  offered contraception to help better regulate cycles, but pt declines  discussed w pt would pursue workup if she had oligomenorrhea for 3+ months  #BV: Discussed this may cause abnormal vaginal discharge, but likely not the cause of vaginal dryness  pt interested in taking Flagyl to tx and see if it helps her symptoms, Rx sent  #hrHPV: Needs repeat pap in 1 yr, discussed implications of +HPV status in detail w pt  Sundra Aland, MD   06/03/2023  11:40 AM

## 2023-06-04 DIAGNOSIS — F411 Generalized anxiety disorder: Secondary | ICD-10-CM | POA: Diagnosis not present

## 2023-10-24 ENCOUNTER — Other Ambulatory Visit: Payer: Self-pay | Admitting: Medical Genetics

## 2023-10-28 ENCOUNTER — Other Ambulatory Visit

## 2023-11-12 ENCOUNTER — Other Ambulatory Visit: Payer: Self-pay

## 2023-11-12 ENCOUNTER — Emergency Department
Admission: EM | Admit: 2023-11-12 | Discharge: 2023-11-12 | Disposition: A | Discharge status: Home / Self Care | Attending: Emergency Medicine | Admitting: Emergency Medicine

## 2023-11-12 DIAGNOSIS — M62838 Other muscle spasm: Secondary | ICD-10-CM | POA: Insufficient documentation

## 2023-11-12 MED ORDER — LIDOCAINE 5 % EX PTCH
1.0000 | MEDICATED_PATCH | CUTANEOUS | Status: DC
  Administered 2023-11-12: 1 via TRANSDERMAL
  Filled 2023-11-12: qty 1

## 2023-11-12 MED ORDER — LIDOCAINE 5 % EX PTCH
1.0000 | MEDICATED_PATCH | Freq: Two times a day (BID) | CUTANEOUS | 0 refills | Status: AC
Start: 2023-11-12 — End: 2023-11-17

## 2023-11-12 MED ORDER — KETOROLAC TROMETHAMINE 15 MG/ML IJ SOLN
15.0000 mg | Freq: Once | INTRAMUSCULAR | Status: AC
  Administered 2023-11-12: 15 mg via INTRAMUSCULAR
  Filled 2023-11-12: qty 1

## 2023-11-12 MED ORDER — NAPROXEN 500 MG PO TABS
500.0000 mg | ORAL_TABLET | Freq: Two times a day (BID) | ORAL | 0 refills | Status: AC
Start: 2023-11-12 — End: 2023-11-19

## 2023-11-12 NOTE — Discharge Instructions (Signed)
 You may take the medications as prescribed.  Please return for any new, worsening, or change in symptoms or other concerns.  It was a pleasure caring for you today.

## 2023-11-12 NOTE — ED Triage Notes (Signed)
 Maybe pulled something at work.  Unsure.  C/O posterior neck pain radiating down left scapula.  Movement makes pain worse.

## 2023-11-12 NOTE — ED Notes (Addendum)
 See triage notes. Pt reports hurt herself at work last Monday while pulling trash out of the trash can. Pt felt something pop in her left shoulder. Pt complaining of left shoulder and neck pain. Last took ibuprofen  yesterday around 0700 with minimal relief. Reports pain at 8/10 at this time.

## 2023-11-12 NOTE — ED Provider Notes (Signed)
 [facility] Provider Note    Event Date/Time   First MD Initiated Contact with Patient 11/12/23 1027     (approximate)   History   No chief complaint on file.   HPI  Alice Ford is a 33 y.o. female who presents today for evaluation of the left trapezius muscle pain.  Patient reports that last week she was pulling something and felt a pain in her left shoulder blade.  She reports that she works as a Engineer, water and this is making her symptoms worse.  She denies chest pain.  She denies neck pain.  She denies trouble breathing.  She has not had any pleurisy.  She denies any numbness, tingling, or weakness in her upper extremities.  No decreased grip things.  She reports that the pain is reproducible with palpation.  There are no active problems to display for this patient.         Physical Exam   Triage Vital Signs: ED Triage Vitals  Encounter Vitals Group     BP 11/12/23 1016 115/60     Girls Systolic BP Percentile --      Girls Diastolic BP Percentile --      Boys Systolic BP Percentile --      Boys Diastolic BP Percentile --      Pulse Rate 11/12/23 1016 97     Resp 11/12/23 1016 16     Temp 11/12/23 1016 98.2 F (36.8 C)     Temp Source 11/12/23 1016 Oral     SpO2 11/12/23 1016 97 %     Weight 11/12/23 1015 152 lb 1.9 oz (69 kg)     Height --      Head Circumference --      Peak Flow --      Pain Score 11/12/23 1014 8     Pain Loc --      Pain Education --      Exclude from Growth Chart --     Most recent vital signs: Vitals:   11/12/23 1027 11/12/23 1121  BP:  110/62  Pulse:  88  Resp:  18  Temp:    SpO2: 100% 100%    Physical Exam Vitals and nursing note reviewed.  Constitutional:      General: Awake and alert. No acute distress.    Appearance: Normal appearance. The patient is normal weight.  HENT:     Head: Normocephalic and atraumatic.     Mouth: Mucous membranes are moist.  Eyes:     General: PERRL. Normal EOMs         Right eye: No discharge.        Left eye: No discharge.     Conjunctiva/sclera: Conjunctivae normal.  Cardiovascular:     Rate and Rhythm: Normal rate and regular rhythm.     Pulses: Normal pulses.  Pulmonary:     Effort: Pulmonary effort is normal. No respiratory distress.     Breath sounds: Normal breath sounds.  Abdominal:     Abdomen is soft. There is no abdominal tenderness. No rebound or guarding. No distention. Musculoskeletal:        General: No swelling. Normal range of motion.     Cervical back: Normal range of motion and neck supple. No midline cervical spine tenderness.  Full range of motion of neck.  Negative Spurling test.  Negative Lhermitte sign.  Negative Hoffmann sign.  Normal strength and sensation in bilateral upper extremities. Normal grip strength bilaterally.  Normal  intrinsic muscle function of the hand bilaterally.  Normal radial pulses bilaterally. Left shoulder: No obvious deformity, swelling, ecchymosis, or erythema.  Tenderness to palpation along the trapezius muscle. No clavicular or AC joint tenderness Able to actively and passively forward flex and abduct at shoulder fully though pain with full range of motion, negative drop arm test Negative Obriens, SLAP, empty can, and lift off tests Normal internal and external rotation against resistance Negative Hawkins and Neers Normal ROM at elbow and wrist Normal resisted pronation and supination 2+ radial pulse Normal grip strength Normal intrinsic hand muscle function Skin:    General: Skin is warm and dry.     Capillary Refill: Capillary refill takes less than 2 seconds.     Findings: No rash.  Neurological:     Mental Status: The patient is awake and alert.      ED Results / Procedures / Treatments   Labs (all labs ordered are listed, but only abnormal results are displayed) Labs Reviewed - No data to display   EKG     RADIOLOGY     PROCEDURES:  Critical Care performed:    Procedures   MEDICATIONS ORDERED IN ED: Medications  lidocaine  (LIDODERM ) 5 % 1 patch (1 patch Transdermal Patch Applied 11/12/23 1104)  ketorolac  (TORADOL ) 15 MG/ML injection 15 mg (15 mg Intramuscular Given 11/12/23 1103)     IMPRESSION / MDM / ASSESSMENT AND PLAN / ED COURSE  I reviewed the triage vital signs and the nursing notes.   Differential diagnosis includes, but is not limited to, muscle spasm, muscle strain, rotator cuff injury, SLAP tear.  Patient is awake and alert, hemodynamically stable and afebrile.  She is nontoxic in appearance.  She has full and normal range of motion of her shoulder.  She has tenderness along her trapezius muscle area.  There are no overlying skin changes.  She has no midline cervical spine tenderness, negative Spurling and Lhermitte sign, negative Hoffmann sign, I do not suspect cervical spine etiology.  She has normal strength and sensation of bilateral upper extremities, not consistent with central cord syndrome.  No chest pain, shortness of breath, pleurisy, clinical signs or symptoms of DVT, hemoptysis, personal or family history of DVT/PE, no recent hospitalizations/immobilizations/recent trips or travel, I do not suspect pulmonary embolism and Wells score is 0 for PE.  She was treated symptomatically with improvement of her symptoms.  We discussed continued symptomatic management at home including warm compresses and gentle stretching.  We also discussed tricked return precautions.  Patient understands and agrees with plan.  She was discharged in stable condition.   Patient's presentation is most consistent with acute complicated illness / injury requiring diagnostic workup.      FINAL CLINICAL IMPRESSION(S) / ED DIAGNOSES   Final diagnoses:  Muscle spasm     Rx / DC Orders   ED Discharge Orders          Ordered    naproxen  (NAPROSYN ) 500 MG tablet  2 times daily with meals        11/12/23 1046    lidocaine  (LIDODERM ) 5 %  Every 12  hours        11/12/23 1046             Note:  This document was prepared using Dragon voice recognition software and may include unintentional dictation errors.   Treyshaun Keatts E, PA-C 11/12/23 1422    Arlander Charleston, MD 11/12/23 1451

## 2023-12-04 DIAGNOSIS — Z202 Contact with and (suspected) exposure to infections with a predominantly sexual mode of transmission: Secondary | ICD-10-CM | POA: Diagnosis not present

## 2024-01-05 ENCOUNTER — Ambulatory Visit
Admission: RE | Admit: 2024-01-05 | Discharge: 2024-01-05 | Disposition: A | Discharge status: Home / Self Care | Source: Ambulatory Visit | Attending: Nurse Practitioner | Admitting: Nurse Practitioner

## 2024-01-05 VITALS — BP 111/79 | HR 87 | Temp 98.4°F | Resp 16 | Wt 152.1 lb

## 2024-01-05 DIAGNOSIS — N3001 Acute cystitis with hematuria: Secondary | ICD-10-CM | POA: Diagnosis not present

## 2024-01-05 DIAGNOSIS — R300 Dysuria: Secondary | ICD-10-CM

## 2024-01-05 LAB — POCT URINE DIPSTICK
Bilirubin, UA: NEGATIVE
Glucose, UA: NEGATIVE mg/dL
Ketones, POC UA: NEGATIVE mg/dL
Nitrite, UA: POSITIVE — AB
POC PROTEIN,UA: 100 — AB
Spec Grav, UA: 1.02 (ref 1.010–1.025)
Urobilinogen, UA: 0.2 U/dL
pH, UA: 7 (ref 5.0–8.0)

## 2024-01-05 LAB — POCT URINE PREGNANCY: Preg Test, Ur: NEGATIVE

## 2024-01-05 MED ORDER — PHENAZOPYRIDINE HCL 200 MG PO TABS: 200.0000 mg | ORAL_TABLET | ORAL | 0 refills | Status: AC

## 2024-01-05 MED ORDER — NITROFURANTOIN MONOHYD MACRO 100 MG PO CAPS: 100.0000 mg | ORAL_CAPSULE | Freq: Two times a day (BID) | ORAL | 0 refills | Status: DC

## 2024-01-05 MED ORDER — FLUCONAZOLE 150 MG PO TABS: 150.0000 mg | ORAL_TABLET | ORAL | 0 refills | Status: AC

## 2024-01-05 NOTE — ED Provider Notes (Signed)
 [facility]    CSN: [id number] Arrival date & time: 01/05/24  1602      History   Chief Complaint Chief Complaint  Patient presents with   URI    Slight burning sensation when i pee. The urge to continue peeing after im done peeing. Vaginal opening itching. - Entered by patient    HPI Alice Ford is a 33 y.o. female.   Discussed the use of AI scribe software for clinical note transcription with the patient, who gave verbal consent to proceed.   The patient is a female presenting with burning with urination, urgency, and itching at the vaginal opening. She reports that symptoms initially began with an abnormal smell when urinating, followed by burning and other symptoms that started on Thursday. She has been experiencing spotting for the past 4 days. Her last menstrual period started on 12/17/23 with the last day being the 17th, and she notes this period was late which was abnormal for her. She denies any discharge other than the spotting. She reports some nausea but denies vomiting, abdominal pain, lower back pain, or fever. She has not tried any over-the-counter treatments for her symptoms.  The following sections of the patient's history were reviewed and updated as appropriate: allergies, current medications, past family history, past medical history, past social history, past surgical history, and problem list.       History reviewed. No pertinent past medical history.  There are no active problems to display for this patient.   History reviewed. No pertinent surgical history.  OB History   No obstetric history on file.      Home Medications    Prior to Admission medications   Medication Sig Start Date End Date Taking? Authorizing Provider  fluconazole (DIFLUCAN) 150 MG tablet Take 1 tablet (150 mg total) by mouth every 3 (three) days for 2 doses. 01/05/24 01/09/24 Yes Ziaire Hagos, FNP  nitrofurantoin, macrocrystal-monohydrate, (MACROBID) 100 MG  capsule Take 1 capsule (100 mg total) by mouth 2 (two) times daily. 01/05/24  Yes Aften Lipsey, FNP  phenazopyridine (PYRIDIUM) 200 MG tablet Take 1 tablet (200 mg total) by mouth 3 (three) times daily at 8am, 3pm and bedtime for 2 days. 01/05/24 01/07/24 Yes Iola Lukes, FNP    Family History History reviewed. No pertinent family history.  Social History Social History   Tobacco Use   Smoking status: Every Day    Passive exposure: Never   Smokeless tobacco: Never  Vaping Use   Vaping status: Never Used  Substance Use Topics   Alcohol use: Yes   Drug use: No     Allergies   Patient has no known allergies.   Review of Systems Review of Systems  Constitutional:  Negative for fever.  Gastrointestinal:  Positive for nausea (mild). Negative for abdominal pain and vomiting.  Genitourinary:  Positive for dysuria, frequency and urgency. Negative for menstrual problem (LMP 9/10 -- still spotting some) and vaginal discharge.       Odor to urine. Itching at the opening of vagina.    Musculoskeletal:  Negative for back pain.  All other systems reviewed and are negative.    Physical Exam Triage Vital Signs ED Triage Vitals  Encounter Vitals Group     BP 01/05/24 1634 111/79     Girls Systolic BP Percentile --      Girls Diastolic BP Percentile --      Boys Systolic BP Percentile --      Boys Diastolic BP Percentile --  Pulse Rate 01/05/24 1634 87     Resp 01/05/24 1634 16     Temp 01/05/24 1634 98.4 F (36.9 C)     Temp Source 01/05/24 1634 Oral     SpO2 01/05/24 1634 98 %     Weight 01/05/24 1625 152 lb 1.9 oz (69 kg)     Height --      Head Circumference --      Peak Flow --      Pain Score 01/05/24 1624 0     Pain Loc --      Pain Education --      Exclude from Growth Chart --    No data found.  Updated Vital Signs BP 111/79 (BP Location: Left Arm)   Pulse 87   Temp 98.4 F (36.9 C) (Oral)   Resp 16   Wt 152 lb 1.9 oz (69 kg)   LMP 12/25/2023  (Exact Date)   SpO2 98%   BMI 26.95 kg/m   Visual Acuity Right Eye Distance:   Left Eye Distance:   Bilateral Distance:    Right Eye Near:   Left Eye Near:    Bilateral Near:     Physical Exam Constitutional:      General: She is not in acute distress.    Appearance: Normal appearance. She is not ill-appearing, toxic-appearing or diaphoretic.  HENT:     Head: Normocephalic.     Nose: Nose normal.     Mouth/Throat:     Mouth: Mucous membranes are moist.  Eyes:     Conjunctiva/sclera: Conjunctivae normal.  Cardiovascular:     Rate and Rhythm: Normal rate.  Pulmonary:     Effort: Pulmonary effort is normal.  Abdominal:     Palpations: Abdomen is soft.  Genitourinary:    Comments: Deferred; patient performed self-swab for Aptima testing  Musculoskeletal:        General: Normal range of motion.     Cervical back: Normal range of motion and neck supple.  Skin:    General: Skin is warm and dry.  Neurological:     General: No focal deficit present.     Mental Status: She is alert and oriented to person, place, and time.  Psychiatric:        Mood and Affect: Mood normal.        Behavior: Behavior normal.      UC Treatments / Results  Labs (all labs ordered are listed, but only abnormal results are displayed) Labs Reviewed  POCT URINE DIPSTICK - Abnormal; Notable for the following components:      Result Value   Clarity, UA cloudy (*)    Blood, UA moderate (*)    POC PROTEIN,UA =100 (*)    Nitrite, UA Positive (*)    Leukocytes, UA Large (3+) (*)    All other components within normal limits  POCT URINE PREGNANCY - Normal  URINE CULTURE    EKG   Radiology No results found.  Procedures Procedures (including critical care time)  Medications Ordered in UC Medications - No data to display  Initial Impression / Assessment and Plan / UC Course  I have reviewed the triage vital signs and the nursing notes.  Pertinent labs & imaging results that were  available during my care of the patient were reviewed by me and considered in my medical decision making (see chart for details).     Patient presents with symptoms consistent with a urinary tract infection. Urinalysis reveals microscopic hematuria, positive  nitrites, and leukocyte esterase, supporting the diagnosis. Nitrofurantoin was prescribed to be taken twice daily for 5 days. Pyridium was prescribed for urinary discomfort, to be taken three times daily for 2 days, with counseling that it may turn the urine orange. She was also prescribed diflucan for possible yeast involvement given her complaints of itching. A urine culture was sent to identify the causative organism and assess antibiotic sensitivity. Patient was advised that they will be contacted only if the culture results require a change in treatment; otherwise, results can be reviewed via MyChart. Patient instructed to increase fluid intake and monitor symptoms. Follow up with primary care provider if symptoms do not improve or worsen. Emergency evaluation is warranted for fever, back or flank pain, nausea, vomiting, or signs of systemic illness.  Today's evaluation has revealed no signs of a dangerous process. Discussed diagnosis with patient and/or guardian. Patient and/or guardian aware of their diagnosis, possible red flag symptoms to watch out for and need for close follow up. Patient and/or guardian understands verbal and written discharge instructions. Patient and/or guardian comfortable with plan and disposition.  Patient and/or guardian has a clear mental status at this time, good insight into illness (after discussion and teaching) and has clear judgment to make decisions regarding their care  Documentation was completed with the aid of voice recognition software. Transcription may contain typographical errors.  Final Clinical Impressions(s) / UC Diagnoses   Final diagnoses:  Dysuria  Acute cystitis with hematuria      Discharge Instructions      You were seen today for symptoms consistent with a urinary tract infection (UTI). You have been prescribed Macrobid to treat the infection and Pyridium to help relieve discomfort such as burning, urgency, and bladder pressure. Take the antibiotics exactly as prescribed and complete the full course, even if you start feeling better. Pyridium may cause your urine to change color, which is a normal side effect of the medication. Diflucan also possible for possible yeast infection given your complaints of vaginal itching. A urine culture has been sent to identify the specific bacteria causing the infection and to confirm that the prescribed antibiotic is appropriate. You will only be contacted if your results are abnormal; otherwise, you may review them in your MyChart account.  It is important to stay well hydrated by drinking plenty of fluids throughout the day. This helps flush out your urinary system and keeps your urine light yellow, which is a sign of good hydration. Avoid caffeine and alcohol, as they can irritate the bladder. Be sure to urinate regularly and empty your bladder fully. Do not hold your urine for extended periods. Always wipe from front to back after using the bathroom and use a clean tissue for each wipe. It is also important to urinate after sexual activity. Avoid douching or using sprays or powders in the genital area, as these can cause irritation. Follow up with your healthcare provider if your symptoms do not improve within a few days, get worse, or return after completing your treatment.       ED Prescriptions     Medication Sig Dispense Auth. Provider   nitrofurantoin, macrocrystal-monohydrate, (MACROBID) 100 MG capsule Take 1 capsule (100 mg total) by mouth 2 (two) times daily. 10 capsule Iola Lukes, FNP   phenazopyridine (PYRIDIUM) 200 MG tablet Take 1 tablet (200 mg total) by mouth 3 (three) times daily at 8am, 3pm and bedtime for  2 days. 6 tablet Tiffay Pinette, FNP   fluconazole (DIFLUCAN) 150  MG tablet Take 1 tablet (150 mg total) by mouth every 3 (three) days for 2 doses. 2 tablet Iola Lukes, FNP      PDMP not reviewed this encounter.   Iola Lukes, [place] 01/05/24 [phone]

## 2024-01-05 NOTE — ED Triage Notes (Signed)
 Slight burning sensation when i pee. The urge to continue peeing after im done peeing. Vaginal opening itching. - Entered by patient

## 2024-01-05 NOTE — Discharge Instructions (Addendum)
 You were seen today for symptoms consistent with a urinary tract infection (UTI). You have been prescribed Macrobid to treat the infection and Pyridium to help relieve discomfort such as burning, urgency, and bladder pressure. Take the antibiotics exactly as prescribed and complete the full course, even if you start feeling better. Pyridium may cause your urine to change color, which is a normal side effect of the medication. Diflucan also possible for possible yeast infection given your complaints of vaginal itching. A urine culture has been sent to identify the specific bacteria causing the infection and to confirm that the prescribed antibiotic is appropriate. You will only be contacted if your results are abnormal; otherwise, you may review them in your MyChart account.  It is important to stay well hydrated by drinking plenty of fluids throughout the day. This helps flush out your urinary system and keeps your urine light yellow, which is a sign of good hydration. Avoid caffeine and alcohol, as they can irritate the bladder. Be sure to urinate regularly and empty your bladder fully. Do not hold your urine for extended periods. Always wipe from front to back after using the bathroom and use a clean tissue for each wipe. It is also important to urinate after sexual activity. Avoid douching or using sprays or powders in the genital area, as these can cause irritation. Follow up with your healthcare provider if your symptoms do not improve within a few days, get worse, or return after completing your treatment.

## 2024-01-08 ENCOUNTER — Ambulatory Visit (HOSPITAL_COMMUNITY): Payer: Self-pay

## 2024-01-08 LAB — URINE CULTURE: Culture: >=100000 — AB

## 2024-01-16 ENCOUNTER — Other Ambulatory Visit: Payer: Self-pay | Admitting: Genetic Counselor

## 2024-01-16 DIAGNOSIS — Z006 Encounter for examination for normal comparison and control in clinical research program: Secondary | ICD-10-CM

## 2024-01-19 ENCOUNTER — Ambulatory Visit (INDEPENDENT_AMBULATORY_CARE_PROVIDER_SITE_OTHER): Admitting: Family Medicine

## 2024-01-19 ENCOUNTER — Other Ambulatory Visit (HOSPITAL_COMMUNITY)
Admission: RE | Admit: 2024-01-19 | Discharge: 2024-01-19 | Disposition: A | Discharge status: Home / Self Care | Source: Ambulatory Visit | Attending: Family Medicine | Admitting: Family Medicine

## 2024-01-19 VITALS — BP 119/68 | HR 73 | Ht 63.0 in | Wt 165.4 lb

## 2024-01-19 DIAGNOSIS — Z1329 Encounter for screening for other suspected endocrine disorder: Secondary | ICD-10-CM | POA: Diagnosis not present

## 2024-01-19 DIAGNOSIS — Z136 Encounter for screening for cardiovascular disorders: Secondary | ICD-10-CM | POA: Diagnosis not present

## 2024-01-19 DIAGNOSIS — Z113 Encounter for screening for infections with a predominantly sexual mode of transmission: Secondary | ICD-10-CM | POA: Diagnosis not present

## 2024-01-19 DIAGNOSIS — Z130 Encounter for screening for diseases of the blood and blood-forming organs and certain disorders involving the immune mechanism: Secondary | ICD-10-CM

## 2024-01-19 DIAGNOSIS — Z1159 Encounter for screening for other viral diseases: Secondary | ICD-10-CM

## 2024-01-19 DIAGNOSIS — Z114 Encounter for screening for human immunodeficiency virus [HIV]: Secondary | ICD-10-CM

## 2024-01-19 DIAGNOSIS — Z0000 Encounter for general adult medical examination without abnormal findings: Secondary | ICD-10-CM

## 2024-01-19 DIAGNOSIS — Z13228 Encounter for screening for other metabolic disorders: Secondary | ICD-10-CM | POA: Diagnosis not present

## 2024-01-19 DIAGNOSIS — Z7689 Persons encountering health services in other specified circumstances: Secondary | ICD-10-CM

## 2024-01-19 NOTE — Progress Notes (Signed)
 New Patient Office Visit  Subjective    Patient ID: Alice Ford, female    DOB: [birth year]  Age: 33 y.o. MRN: [id number]  CC:  Chief Complaint  Patient presents with   Establish Care   Annual Exam    HPI Alice Ford presents to establish care and for routine annual exam. Patient denies known chronic med issues or acute complaints.    Outpatient Encounter Medications as of 01/19/2024  Medication Sig   nitrofurantoin, macrocrystal-monohydrate, (MACROBID) 100 MG capsule Take 1 capsule (100 mg total) by mouth 2 (two) times daily.   No facility-administered encounter medications on file as of 01/19/2024.    History reviewed. No pertinent past medical history.  History reviewed. No pertinent surgical history.  History reviewed. No pertinent family history.  Social History   Socioeconomic History   Marital status: Single    Spouse name: Not on file   Number of children: Not on file   Years of education: Not on file   Highest education level: Some college, no degree  Occupational History   Not on file  Tobacco Use   Smoking status: Every Day    Passive exposure: Never   Smokeless tobacco: Never  Vaping Use   Vaping status: Never Used  Substance and Sexual Activity   Alcohol use: Yes   Drug use: No   Sexual activity: Yes    Birth control/protection: None  Other Topics Concern   Not on file  Social History Narrative   Not on file   Social Drivers of Health   Financial Resource Strain: Low Risk  (01/19/2024)   Overall Financial Resource Strain (CARDIA)    Difficulty of Paying Living Expenses: Not very hard  Food Insecurity: Food Insecurity Present (01/19/2024)   Hunger Vital Sign    Worried About Running Out of Food in the Last Year: Sometimes true    Ran Out of Food in the Last Year: Sometimes true  Transportation Needs: No Transportation Needs (01/19/2024)   PRAPARE - Administrator, Civil Service (Medical): No    Lack of Transportation  (Non-Medical): No  Physical Activity: Sufficiently Active (01/19/2024)   Exercise Vital Sign    Days of Exercise per Week: 5 days    Minutes of Exercise per Session: 150+ min  Stress: Stress Concern Present (01/19/2024)   [employer] of Occupational Health - Occupational Stress Questionnaire    Feeling of Stress: To some extent  Social Connections: Moderately Isolated (01/19/2024)   Social Connection and Isolation Panel    Frequency of Communication with Friends and Family: More than three times a week    Frequency of Social Gatherings with Friends and Family: More than three times a week    Attends Religious Services: Never    Database administrator or Organizations: No    Attends Engineer, structural: Not on file    Marital Status: Living with partner  Intimate Partner Violence: Not on file    Review of Systems  All other systems reviewed and are negative.       Objective   BP 119/68   Pulse 73   Ht 5' 3 (1.6 m)   Wt 165 lb 6.4 oz (75 kg)   LMP 12/25/2023 (Exact Date)   SpO2 93%   BMI 29.30 kg/m   Physical Exam Vitals and nursing note reviewed.  Constitutional:      General: She is not in acute distress. HENT:     Head: Normocephalic and atraumatic.  Right Ear: Tympanic membrane, ear canal and external ear normal.     Left Ear: Tympanic membrane, ear canal and external ear normal.     Nose: Nose normal.     Mouth/Throat:     Mouth: Mucous membranes are moist.     Pharynx: Oropharynx is clear.  Eyes:     Conjunctiva/sclera: Conjunctivae normal.     Pupils: Pupils are equal, round, and reactive to light.  Neck:     Thyroid : No thyromegaly.  Cardiovascular:     Rate and Rhythm: Normal rate and regular rhythm.     Heart sounds: Normal heart sounds. No murmur heard. Pulmonary:     Effort: Pulmonary effort is normal. No respiratory distress.     Breath sounds: Normal breath sounds.  Abdominal:     General: There is no distension.      Palpations: Abdomen is soft. There is no mass.     Tenderness: There is no abdominal tenderness.  Musculoskeletal:        General: Normal range of motion.     Cervical back: Normal range of motion and neck supple.  Skin:    General: Skin is warm and dry.  Neurological:     General: No focal deficit present.     Mental Status: She is alert and oriented to person, place, and time.  Psychiatric:        Mood and Affect: Mood normal.        Behavior: Behavior normal.         Assessment & Plan:   Annual physical exam -     CMP14+EGFR  Encounter to establish care  Screening for deficiency anemia -     CBC with Differential/Platelet  Encounter for screening for cardiovascular disorders -     Lipid panel  Screening for endocrine/metabolic/immunity disorders -     Hemoglobin A1c  Screening for HIV (human immunodeficiency virus) -     Hepatitis C antibody  Need for hepatitis C screening test -     HIV Antibody (routine testing w rflx)  Screening for STDs (sexually transmitted diseases) -     Cervicovaginal ancillary only     No follow-ups on file.   Tanda Raguel SQUIBB, MD

## 2024-01-20 ENCOUNTER — Ambulatory Visit: Payer: Self-pay | Admitting: Family Medicine

## 2024-01-20 LAB — CBC WITH DIFFERENTIAL/PLATELET
Basophils Absolute: 0.1 x10E3/uL (ref 0.0–0.2)
Basos: 1 %
EOS (ABSOLUTE): 0.1 x10E3/uL (ref 0.0–0.4)
Eos: 1 %
Hematocrit: 40.9 % (ref 34.0–46.6)
Hemoglobin: 13.6 g/dL (ref 11.1–15.9)
Immature Grans (Abs): 0 x10E3/uL (ref 0.0–0.1)
Immature Granulocytes: 0 %
Lymphocytes Absolute: 2.9 x10E3/uL (ref 0.7–3.1)
Lymphs: 44 %
MCH: 31.9 pg (ref 26.6–33.0)
MCHC: 33.3 g/dL (ref 31.5–35.7)
MCV: 96 fL (ref 79–97)
Monocytes Absolute: 0.3 x10E3/uL (ref 0.1–0.9)
Monocytes: 4 %
Neutrophils Absolute: 3.3 x10E3/uL (ref 1.4–7.0)
Neutrophils: 50 %
Platelets: 302 x10E3/uL (ref 150–450)
RBC: 4.26 x10E6/uL (ref 3.77–5.28)
RDW: 11.7 % (ref 11.7–15.4)
WBC: 6.7 x10E3/uL (ref 3.4–10.8)

## 2024-01-20 LAB — LIPID PANEL
Chol/HDL Ratio: 2.8 ratio (ref 0.0–4.4)
Cholesterol, Total: 169 mg/dL (ref 100–199)
HDL: 60 mg/dL (ref >39)
LDL Chol Calc (NIH): 97 mg/dL (ref 0–99)
Triglycerides: 60 mg/dL (ref 0–149)
VLDL Cholesterol Cal: 12 mg/dL (ref 5–40)

## 2024-01-20 LAB — CMP14+EGFR
ALT: 7 IU/L (ref 0–32)
AST: 13 IU/L (ref 0–40)
Albumin: 4.7 g/dL (ref 3.9–4.9)
Alkaline Phosphatase: 77 IU/L (ref 41–116)
BUN/Creatinine Ratio: 17 (ref 9–23)
BUN: 10 mg/dL (ref 6–20)
Bilirubin Total: 0.4 mg/dL (ref 0.0–1.2)
CO2: 26 mmol/L (ref 20–29)
Calcium: 9.7 mg/dL (ref 8.7–10.2)
Chloride: 101 mmol/L (ref 96–106)
Creatinine, Ser: 0.59 mg/dL (ref 0.57–1.00)
Globulin, Total: 2.9 g/dL (ref 1.5–4.5)
Glucose: 82 mg/dL (ref 70–99)
Potassium: 3.8 mmol/L (ref 3.5–5.2)
Sodium: 139 mmol/L (ref 134–144)
Total Protein: 7.6 g/dL (ref 6.0–8.5)
eGFR: 123 mL/min/1.73 (ref >59)

## 2024-01-20 LAB — CERVICOVAGINAL ANCILLARY ONLY
Comment: NEGATIVE
Comment: NEGATIVE
Comment: NEGATIVE
Comment: NEGATIVE
Comment: NEGATIVE
Comment: NORMAL

## 2024-01-20 LAB — HIV ANTIBODY (ROUTINE TESTING W REFLEX): HIV Screen 4th Generation wRfx: NONREACTIVE

## 2024-01-20 LAB — HEMOGLOBIN A1C
Est. average glucose Bld gHb Est-mCnc: 103 mg/dL
Hgb A1c MFr Bld: 5.2 % (ref 4.8–5.6)

## 2024-01-20 LAB — HEPATITIS C ANTIBODY: Hep C Virus Ab: NONREACTIVE

## 2024-01-20 NOTE — Progress Notes (Signed)
 Attempted to call patient. No answer, LVM to call back regarding lab results.
